# Patient Record
Sex: Female | Born: 2000 | Race: Black or African American | Hispanic: No | Marital: Single
Health system: Southern US, Community
[De-identification: ages and names within clinical notes are randomized; demographics above are authoritative.]

---

## 2020-08-26 ENCOUNTER — Encounter (HOSPITAL_COMMUNITY): Payer: Self-pay | Admitting: Emergency Medicine

## 2020-08-26 ENCOUNTER — Emergency Department (HOSPITAL_COMMUNITY): Payer: BC Managed Care – PPO

## 2020-08-26 ENCOUNTER — Other Ambulatory Visit: Payer: Self-pay

## 2020-08-26 ENCOUNTER — Emergency Department (HOSPITAL_COMMUNITY)
Admission: EM | Admit: 2020-08-26 | Discharge: 2020-08-27 | Disposition: A | Discharge status: Home / Self Care | Payer: BC Managed Care – PPO | Attending: Emergency Medicine | Admitting: Emergency Medicine

## 2020-08-26 DIAGNOSIS — M79604 Pain in right leg: Secondary | ICD-10-CM | POA: Insufficient documentation

## 2020-08-26 DIAGNOSIS — R0602 Shortness of breath: Secondary | ICD-10-CM | POA: Diagnosis not present

## 2020-08-26 DIAGNOSIS — R200 Anesthesia of skin: Secondary | ICD-10-CM | POA: Diagnosis not present

## 2020-08-26 DIAGNOSIS — R55 Syncope and collapse: Secondary | ICD-10-CM | POA: Insufficient documentation

## 2020-08-26 DIAGNOSIS — R531 Weakness: Secondary | ICD-10-CM | POA: Diagnosis not present

## 2020-08-26 DIAGNOSIS — R42 Dizziness and giddiness: Secondary | ICD-10-CM | POA: Diagnosis present

## 2020-08-26 LAB — CBC
HCT: 40.3 % (ref 36.0–46.0)
Hemoglobin: 13.1 g/dL (ref 12.0–15.0)
MCH: 27.1 pg (ref 26.0–34.0)
MCHC: 32.5 g/dL (ref 30.0–36.0)
MCV: 83.3 fL (ref 80.0–100.0)
Platelets: 314 10*3/uL (ref 150–400)
RBC: 4.84 MIL/uL (ref 3.87–5.11)
RDW: 12.2 % (ref 11.5–15.5)
WBC: 7.9 10*3/uL (ref 4.0–10.5)
nRBC: 0 % (ref 0.0–0.2)

## 2020-08-26 LAB — BASIC METABOLIC PANEL
Anion gap: 12 (ref 5–15)
BUN: 10 mg/dL (ref 6–20)
CO2: 26 mmol/L (ref 22–32)
Calcium: 10.2 mg/dL (ref 8.9–10.3)
Chloride: 101 mmol/L (ref 98–111)
Creatinine, Ser: 0.83 mg/dL (ref 0.44–1.00)
GFR calc Af Amer: >60 mL/min (ref >60)
GFR calc non Af Amer: >60 mL/min (ref >60)
Glucose, Bld: 93 mg/dL (ref 70–99)
Potassium: 3.9 mmol/L (ref 3.5–5.1)
Sodium: 139 mmol/L (ref 135–145)

## 2020-08-26 LAB — I-STAT BETA HCG BLOOD, ED (MC, WL, AP ONLY): I-stat hCG, quantitative: <5 m[IU]/mL (ref <5)

## 2020-08-26 LAB — CBG MONITORING, ED: Glucose-Capillary: 80 mg/dL (ref 70–99)

## 2020-08-26 LAB — TROPONIN I (HIGH SENSITIVITY): Troponin I (High Sensitivity): <2 ng/L (ref <18)

## 2020-08-26 NOTE — ED Triage Notes (Signed)
Patient states she got dizzy and about to pass out. Her hands and feet went numb also. Patient states that she ate earlier today. Patient says this started around 7 something tonight.

## 2020-08-26 NOTE — ED Provider Notes (Signed)
Amber Mclean   CSN: [id number] Arrival date & time: 08/26/20  2140     History Chief Complaint  Patient presents with  . Dizziness    Amber Mclean is a 19 y.o. female with no significant past medical history who presents to the emergency department with a chief complaint of lightheadedness.  The patient reports that she was at her job when she suddenly became short of breath and lightheaded with standing followed by paresthesias in her bilateral hands and feet.  States that she felt as if she might pass out.  She reports that she developed some numbness and weakness in her right lower leg and foot around when the paresthesias began.  She reports that the episode lasted for approximately 15 minutes before resolving.  States that the numbness and weakness in her right lower leg has persisted.  She is also intermittently having some pain that is sharp that radiates from her right side down into her right foot.  She denies any known specific injury.  No focal knee or hip pain.  States that she was given a popsicle and drink some water and shortness of breath and lightheadedness improved.  She works at [employer].  She denies any changes and p.o. intake.  No history of similar symptoms.  She denies increased stress.  No chest pain, cough, fever, chills, nausea, vomiting, diarrhea, diaphoresis, difficulty urinating, headache, neck pain, urinary or fecal incontinence, back pain.  No syncope, seizure-like activity.  No falls.  No treatment prior to arrival.  The history is provided by the patient and medical records. No language interpreter was used.       History reviewed. No pertinent past medical history.  There are no problems to display for this patient.  OB History   No obstetric history on file.     History reviewed. No pertinent family history.  Social History   Tobacco Use  . Smoking status: Not on file  Substance Use Topics  .  Alcohol use: Not on file  . Drug use: Not on file    Home Medications Prior to Admission medications   Not on File    Allergies    Penicillins  Review of Systems   Review of Systems  Constitutional: Negative for activity change, chills and fever.  HENT: Negative for sore throat.   Eyes: Negative for visual disturbance.  Respiratory: Positive for shortness of breath. Negative for cough and wheezing.   Cardiovascular: Negative for chest pain.  Gastrointestinal: Negative for abdominal pain, constipation, diarrhea, nausea and vomiting.  Genitourinary: Negative for dysuria, hematuria and urgency.  Musculoskeletal: Negative for back pain, joint swelling, myalgias, neck pain and neck stiffness.  Skin: Negative for rash.  Allergic/Immunologic: Negative for immunocompromised state.  Neurological: Positive for weakness, light-headedness and numbness. Negative for seizures, syncope, speech difficulty and headaches.       Paresthesias  Psychiatric/Behavioral: Negative for confusion.    Physical Exam Updated Vital Signs BP 118/73   Pulse 63   Temp 98 F (36.7 C)   Resp 18   Ht 5\' 4"  (1.626 m)   Wt 67.1 kg   LMP  (LMP Unknown)   SpO2 100%   BMI 25.40 kg/m   Physical Exam Vitals and nursing Mclean reviewed.  Constitutional:      General: She is not in acute distress.    Comments: Well-appearing.  No acute distress.  HENT:     Head: Normocephalic.  Eyes:  Conjunctiva/sclera: Conjunctivae normal.  Cardiovascular:     Rate and Rhythm: Normal rate and regular rhythm.     Pulses: Normal pulses.     Heart sounds: Normal heart sounds. No murmur heard.  No friction rub. No gallop.   Pulmonary:     Effort: Pulmonary effort is normal. No respiratory distress.     Breath sounds: No stridor. No wheezing, rhonchi or rales.  Chest:     Chest wall: No tenderness.  Abdominal:     General: There is no distension.     Palpations: Abdomen is soft. There is no mass.     Tenderness:  There is no abdominal tenderness. There is no right CVA tenderness, left CVA tenderness, guarding or rebound.     Hernia: No hernia is present.  Musculoskeletal:        General: No tenderness.     Cervical back: Neck supple.     Right lower leg: No edema.     Left lower leg: No edema.  Skin:    General: Skin is warm.     Findings: No rash.  Neurological:     Mental Status: She is alert.     Comments: Cranial nerves II through XII are grossly intact.  5 of 5 strength against resistance of the bilateral upper and lower extremities.  Sensation is intact and equal throughout.  Normal tandem gait.  Normal toe walking and heel walking.  Heel-to-shin is intact bilaterally.  Negative Romberg.  No pronator drift.  Finger-to-nose is intact bilaterally.  DTRs are 2+ and symmetric.  Psychiatric:        Behavior: Behavior normal.     ED Results / Procedures / Treatments   Labs (all labs ordered are listed, but only abnormal results are displayed) Labs Reviewed  BASIC METABOLIC PANEL  CBC  CBG MONITORING, ED  I-STAT BETA HCG BLOOD, ED ([facility], [facility], [facility] ONLY)  TROPONIN I (HIGH SENSITIVITY)  TROPONIN I (HIGH SENSITIVITY)    EKG EKG Interpretation  Date/Time:  Tuesday August 26 2020 21:53:59 EDT Ventricular Rate:  64 PR Interval:    QRS Duration: 94 QT Interval:  386 QTC Calculation: 399 R Axis:   73 Text Interpretation: Sinus rhythm Borderline short PR interval 12 Lead; Mason-Likar Confirmed by Ross Marcus ([id number]) on 08/26/2020 11:55:36 PM   Radiology DG Chest 2 View  Result Date: 08/26/2020 CLINICAL DATA:  Chest pain EXAM: CHEST - 2 VIEW COMPARISON:  None. FINDINGS: The heart size and mediastinal contours are within normal limits. Both lungs are clear. The visualized skeletal structures are unremarkable. IMPRESSION: No active cardiopulmonary disease. Electronically Signed   By: Jasmine Pang M.D.   On: 08/26/2020 22:12    Procedures Procedures (including critical care  time)  Medications Ordered in ED Medications  ketorolac (TORADOL) injection 30 mg (30 mg Intramuscular Given 08/27/20 0030)    ED Course  I have reviewed the triage vital signs and the nursing notes.  Pertinent labs & imaging results that were available during my care of the patient were reviewed by me and considered in my medical decision making (see chart for details).    MDM Rules/Calculators/A&P                          19 year old otherwise healthy female presenting with near syncopal episode accompanied by lightheadedness, shortness of breath, and paresthesias earlier tonight.  Symptoms resolved prior to arrival.  She does Mclean that she has had some persistent pain  in her right thigh that radiates down to her right leg.  No known injuries.  She is neurologically intact.  Patient did initially expressed concern for weakness in the right leg.  There was no evidence of foot drop on my evaluation, the patient was evaluated by Dr. Wilkie Aye, attending physician.  Question if the patient has had a hypoglycemic episode since glucose was 80 versus symptoms related to anxiety or panic attack.  Troponin is normal.  EKG with normal sinus rhythm.  Chest x-ray is unremarkable.  No metabolic derangements.  Patient has had no further episodes of near syncope or lightheadedness.  No shortness of breath.  She is PERC negative.  For the pain in her right leg, she was given Toradol and on reevaluation, gait had improved and she no longer felt as if she had any numbness or weakness.  She is well-appearing and in no acute distress.  Safe for discharge to home with outpatient follow-up at [facility].  Final Clinical Impression(s) / ED Diagnoses Final diagnoses:  Near syncope  Right leg pain    Rx / DC Orders ED Discharge Orders    None       Barkley Boards, PA-C 08/27/20 [id number]    Shon Baton, MD 09/02/20 [id number]

## 2020-08-27 LAB — TROPONIN I (HIGH SENSITIVITY): Troponin I (High Sensitivity): <2 ng/L (ref <18)

## 2020-08-27 MED ORDER — KETOROLAC TROMETHAMINE 60 MG/2ML IM SOLN
30.0000 mg | Freq: Once | INTRAMUSCULAR | Status: AC
  Administered 2020-08-27: 30 mg via INTRAMUSCULAR
  Filled 2020-08-27: qty 2

## 2020-08-27 NOTE — Discharge Instructions (Signed)
Thank you for allowing me to care for you today in the Emergency Department.   You were seen today in the emergency department.  Her work-up is reassuring.  Regarding the pain and symptoms in her right leg, elevate the right leg so that your toes are at or above the level of your nose.  You can apply ice pack for 15 to 20 minutes up to 3-4 times a day to help with pain and swelling.  Take 650 mg of Tylenol or 600 mg of ibuprofen with food every 6 hours for pain.  You can alternate between these 2 medications every 3 hours if your pain returns.  For instance, you can take Tylenol at noon, followed by a dose of ibuprofen at 3, followed by second dose of Tylenol and 6.  Please follow-up with student health for a recheck of your symptoms in the next week.  When you are at work, make sure that you were staying hydrated.   Return to the emergency department if you pass out, if you become unable to walk, if your toes turn blue, or if you develop any new, concerning symptoms.

## 2021-04-30 IMAGING — CR DG CHEST 2V
2 series · 2 of 2 positions shown · non-contrast
Comparison: None.

CLINICAL DATA: Chest pain

EXAM:
CHEST - 2 VIEW

[w chest pa]
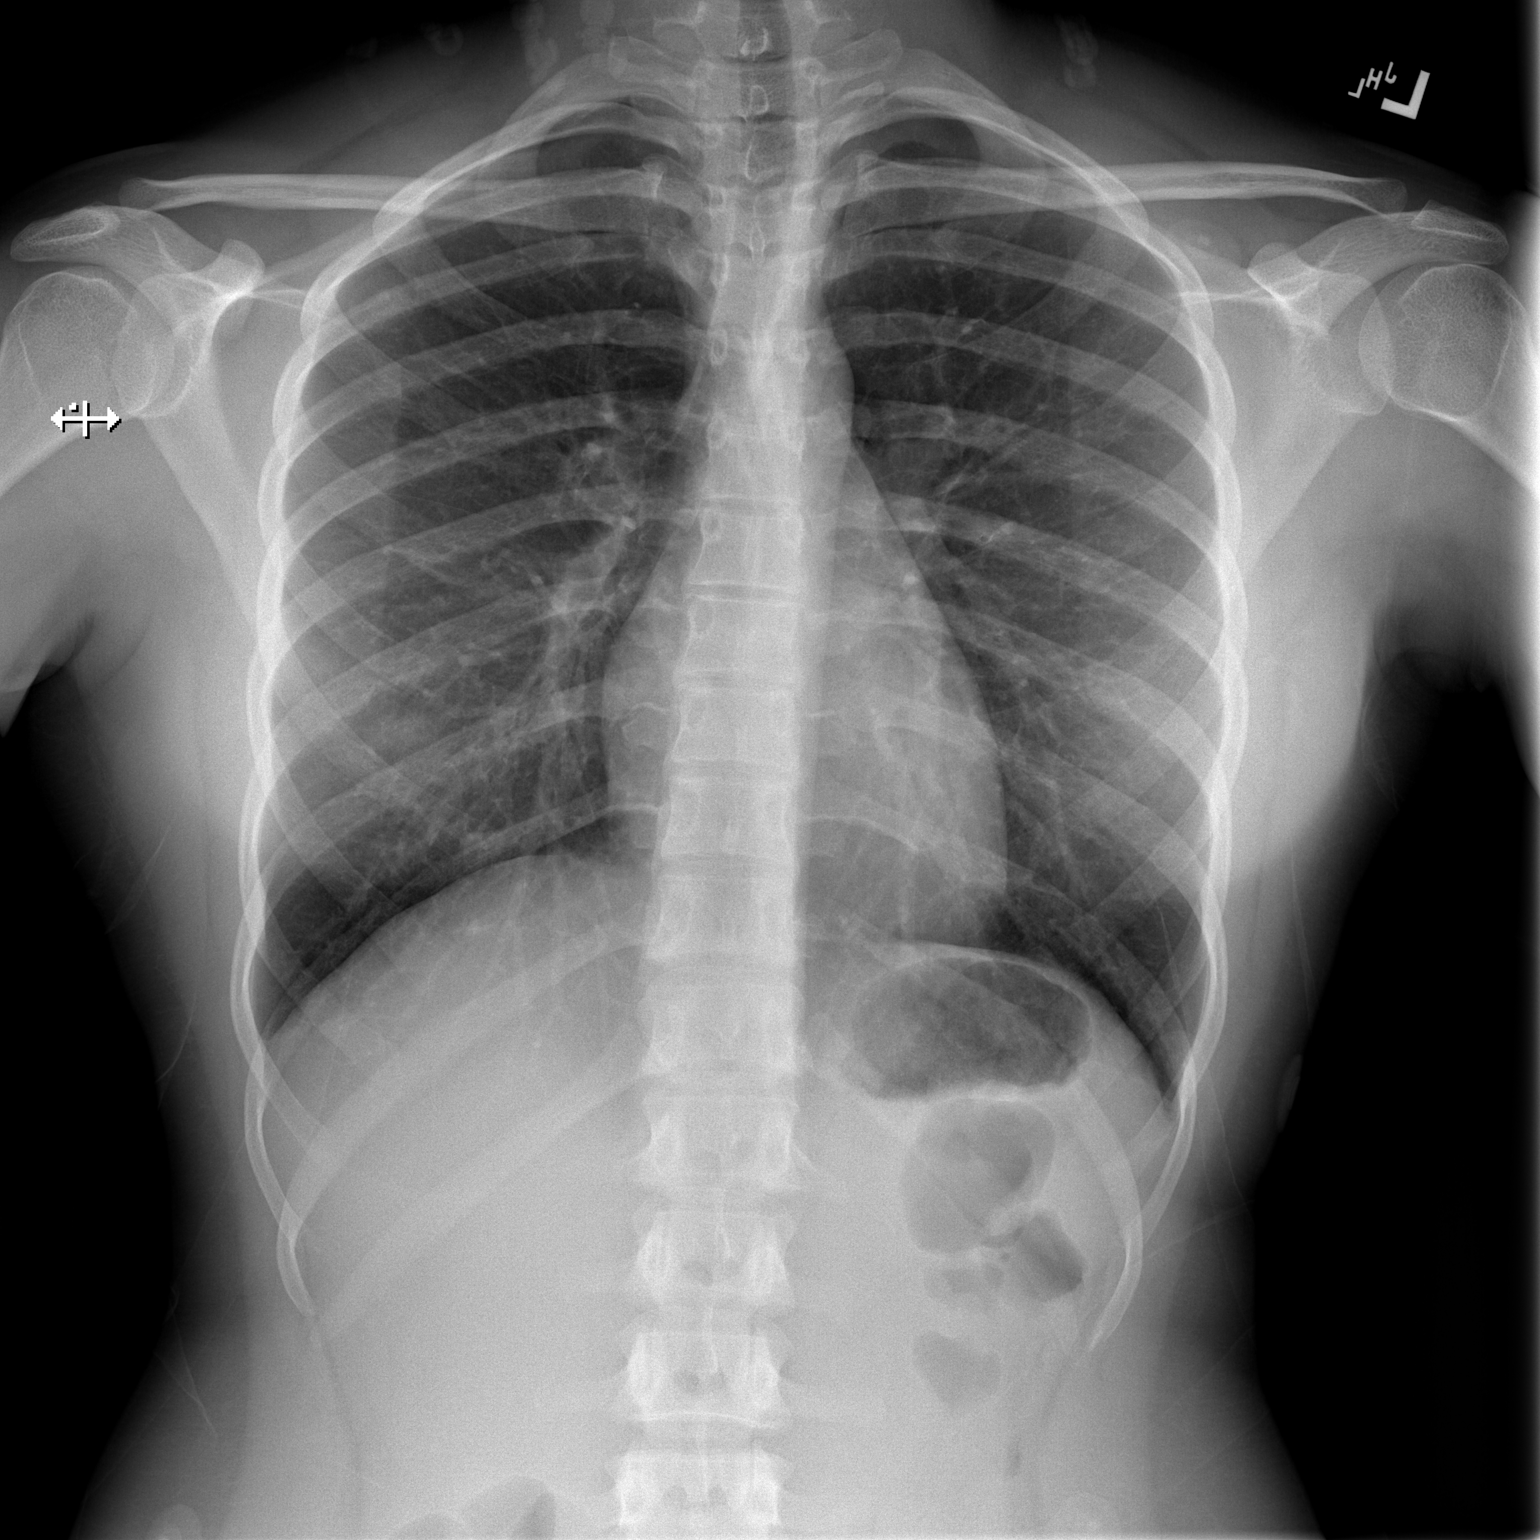

[w chest lat]
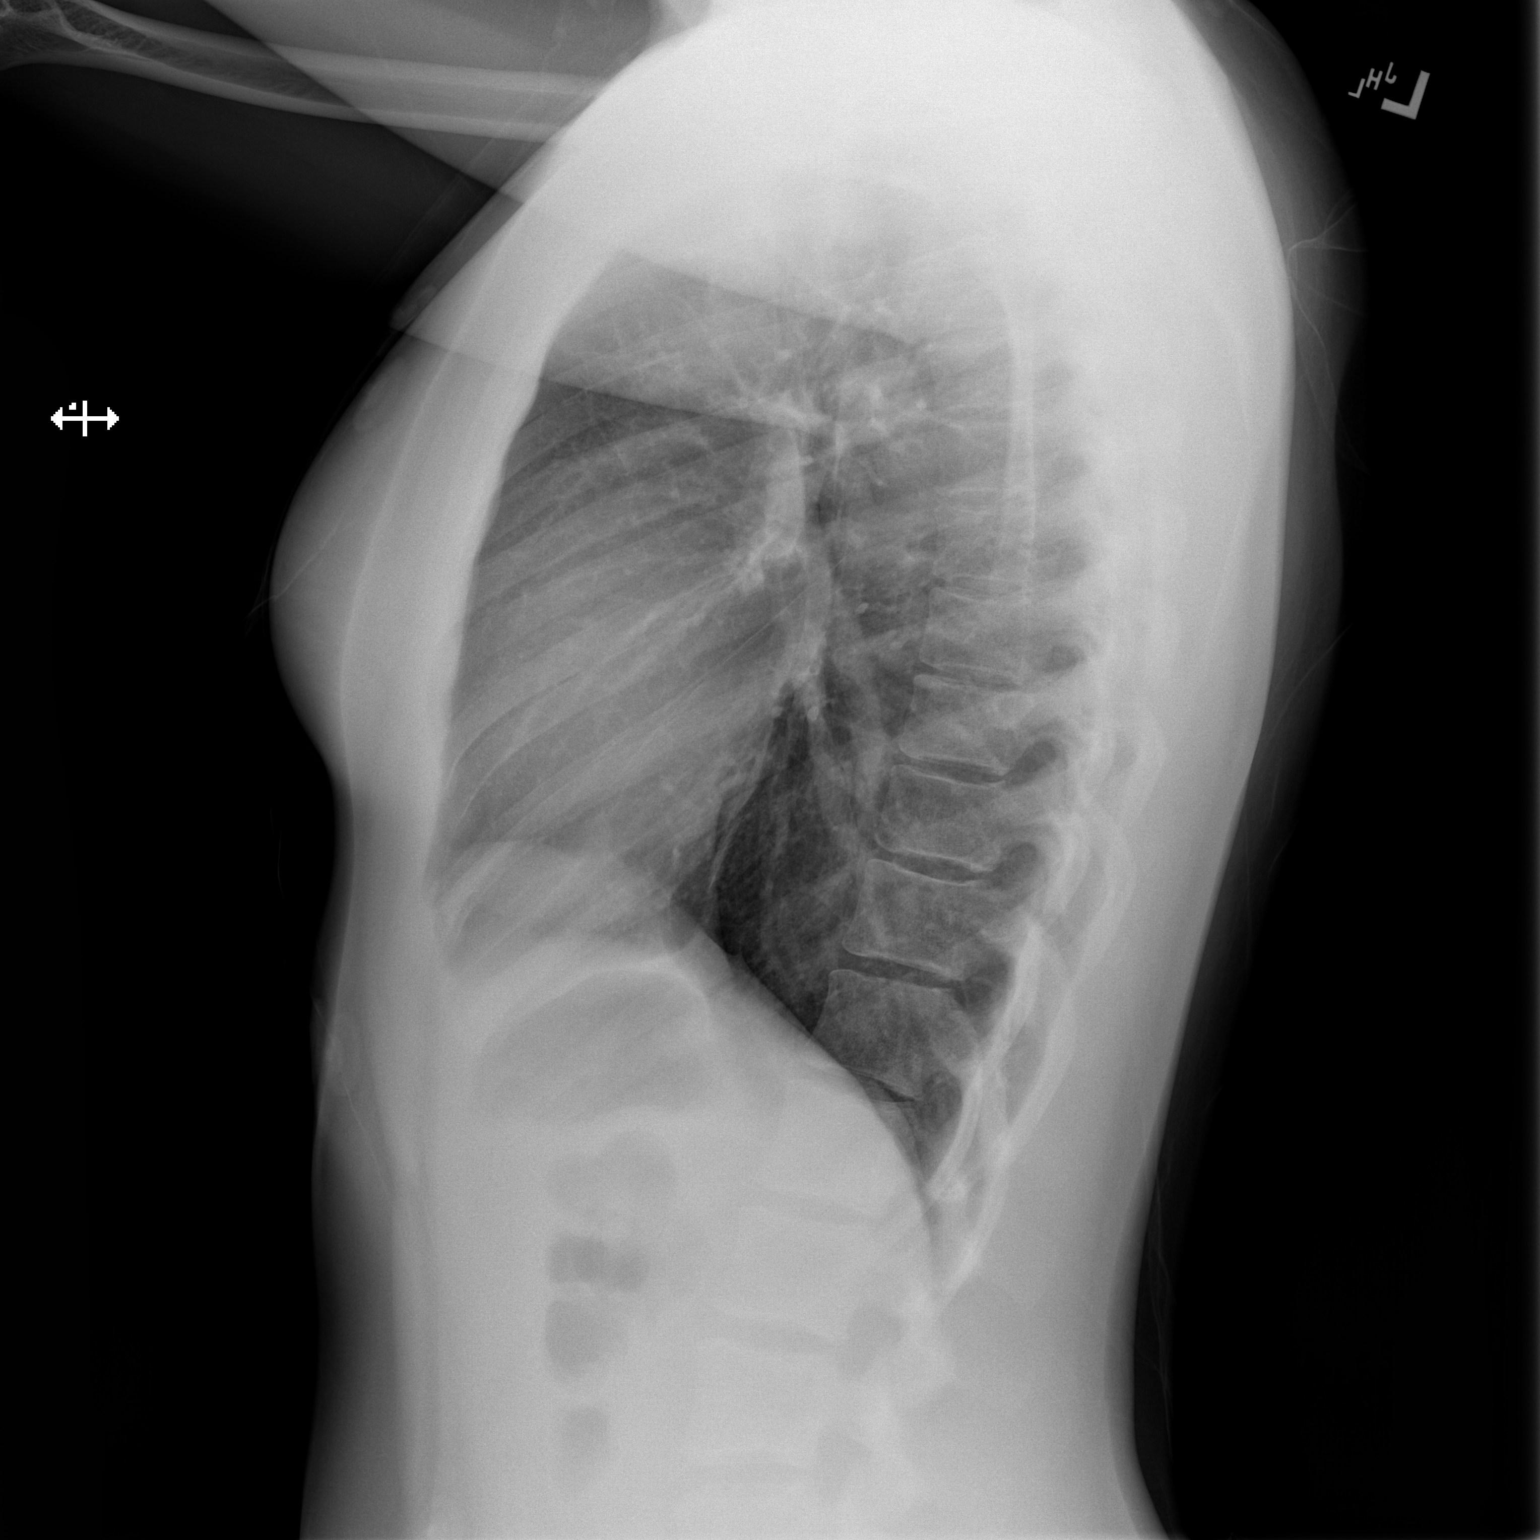

[2 of 2 positions shown; findings below may reference images not displayed]

FINDINGS: The heart size and mediastinal contours are within normal limits.
Both lungs are clear. The visualized skeletal structures are
unremarkable.
IMPRESSION: No active cardiopulmonary disease.
